# Patient Record
Sex: Male | Born: 2000 | Race: White | Hispanic: No | Marital: Single | Smoking: Never smoker
Health system: Southern US, Community
[De-identification: ages and names within clinical notes are randomized; demographics above are authoritative.]

## PROBLEM LIST (undated history)

## (undated) DIAGNOSIS — K311 Adult hypertrophic pyloric stenosis: Secondary | ICD-10-CM

## (undated) DIAGNOSIS — J302 Other seasonal allergic rhinitis: Secondary | ICD-10-CM

## (undated) HISTORY — PX: OTHER SURGICAL HISTORY: SHX169

---
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
## 2021-09-02 ENCOUNTER — Ambulatory Visit: Payer: Medicaid Other | Admitting: Physical Therapy

## 2021-09-17 ENCOUNTER — Ambulatory Visit: Payer: Medicaid Other | Attending: Nurse Practitioner

## 2022-08-11 ENCOUNTER — Emergency Department (HOSPITAL_COMMUNITY): Payer: BC Managed Care – PPO

## 2022-08-11 ENCOUNTER — Emergency Department (HOSPITAL_COMMUNITY)
Admission: EM | Admit: 2022-08-11 | Discharge: 2022-08-11 | Disposition: A | Discharge status: Home / Self Care | Payer: BC Managed Care – PPO | Attending: Emergency Medicine | Admitting: Emergency Medicine

## 2022-08-11 DIAGNOSIS — Y92410 Unspecified street and highway as the place of occurrence of the external cause: Secondary | ICD-10-CM | POA: Diagnosis not present

## 2022-08-11 DIAGNOSIS — M791 Myalgia, unspecified site: Secondary | ICD-10-CM | POA: Insufficient documentation

## 2022-08-11 DIAGNOSIS — M546 Pain in thoracic spine: Secondary | ICD-10-CM | POA: Insufficient documentation

## 2022-08-11 DIAGNOSIS — M542 Cervicalgia: Secondary | ICD-10-CM | POA: Diagnosis not present

## 2022-08-11 DIAGNOSIS — M25511 Pain in right shoulder: Secondary | ICD-10-CM | POA: Diagnosis not present

## 2022-08-11 DIAGNOSIS — M7918 Myalgia, other site: Secondary | ICD-10-CM

## 2022-08-11 MED ORDER — CYCLOBENZAPRINE HCL 10 MG PO TABS
5.0000 mg | ORAL_TABLET | Freq: Once | ORAL | Status: DC
  Filled 2022-08-11: qty 1

## 2022-08-11 MED ORDER — ACETAMINOPHEN 325 MG PO TABS
650.0000 mg | ORAL_TABLET | Freq: Once | ORAL | Status: AC
  Administered 2022-08-11: 650 mg via ORAL
  Filled 2022-08-11: qty 2

## 2022-08-11 MED ORDER — CYCLOBENZAPRINE HCL 10 MG PO TABS: 10.0000 mg | ORAL_TABLET | Freq: Two times a day (BID) | ORAL | 0 refills | Status: AC | PRN

## 2022-08-11 MED ORDER — IBUPROFEN 400 MG PO TABS
400.0000 mg | ORAL_TABLET | Freq: Once | ORAL | Status: AC
  Administered 2022-08-11: 400 mg via ORAL
  Filled 2022-08-11: qty 1
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
